# Patient Record
Sex: Male | Born: 1990 | Race: White | Hispanic: No | Marital: Single | Smoking: Current every day smoker
Health system: Southern US, Community
[De-identification: ages and names within clinical notes are randomized; demographics above are authoritative.]

## PROBLEM LIST (undated history)

## (undated) HISTORY — PX: WISDOM TOOTH EXTRACTION: SHX21

---
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
## 2011-05-07 ENCOUNTER — Inpatient Hospital Stay (INDEPENDENT_AMBULATORY_CARE_PROVIDER_SITE_OTHER)
Admission: RE | Admit: 2011-05-07 | Discharge: 2011-05-07 | Disposition: A | Discharge status: Home / Self Care | Payer: 59 | Source: Ambulatory Visit | Attending: Family Medicine | Admitting: Family Medicine

## 2011-05-07 DIAGNOSIS — T6391XA Toxic effect of contact with unspecified venomous animal, accidental (unintentional), initial encounter: Secondary | ICD-10-CM

## 2011-05-08 ENCOUNTER — Inpatient Hospital Stay (INDEPENDENT_AMBULATORY_CARE_PROVIDER_SITE_OTHER)
Admission: RE | Admit: 2011-05-08 | Discharge: 2011-05-08 | Disposition: A | Discharge status: Home / Self Care | Payer: 59 | Source: Ambulatory Visit | Attending: Emergency Medicine | Admitting: Emergency Medicine

## 2011-05-08 DIAGNOSIS — T6391XA Toxic effect of contact with unspecified venomous animal, accidental (unintentional), initial encounter: Secondary | ICD-10-CM

## 2016-04-18 ENCOUNTER — Other Ambulatory Visit: Payer: Self-pay | Admitting: Physical Medicine and Rehabilitation

## 2016-04-18 ENCOUNTER — Ambulatory Visit
Admission: RE | Admit: 2016-04-18 | Discharge: 2016-04-18 | Disposition: A | Discharge status: Home / Self Care | Payer: No Typology Code available for payment source | Source: Ambulatory Visit | Attending: Physical Medicine and Rehabilitation | Admitting: Physical Medicine and Rehabilitation

## 2016-04-18 DIAGNOSIS — Z0000 Encounter for general adult medical examination without abnormal findings: Secondary | ICD-10-CM

## 2017-08-07 IMAGING — CR DG CHEST 1V
1 series · 1 of 1 positions shown · non-contrast
Comparison: No recent.

CLINICAL DATA: Employ evaluation.

EXAM:
CHEST 1 VIEW

[w chest pa]
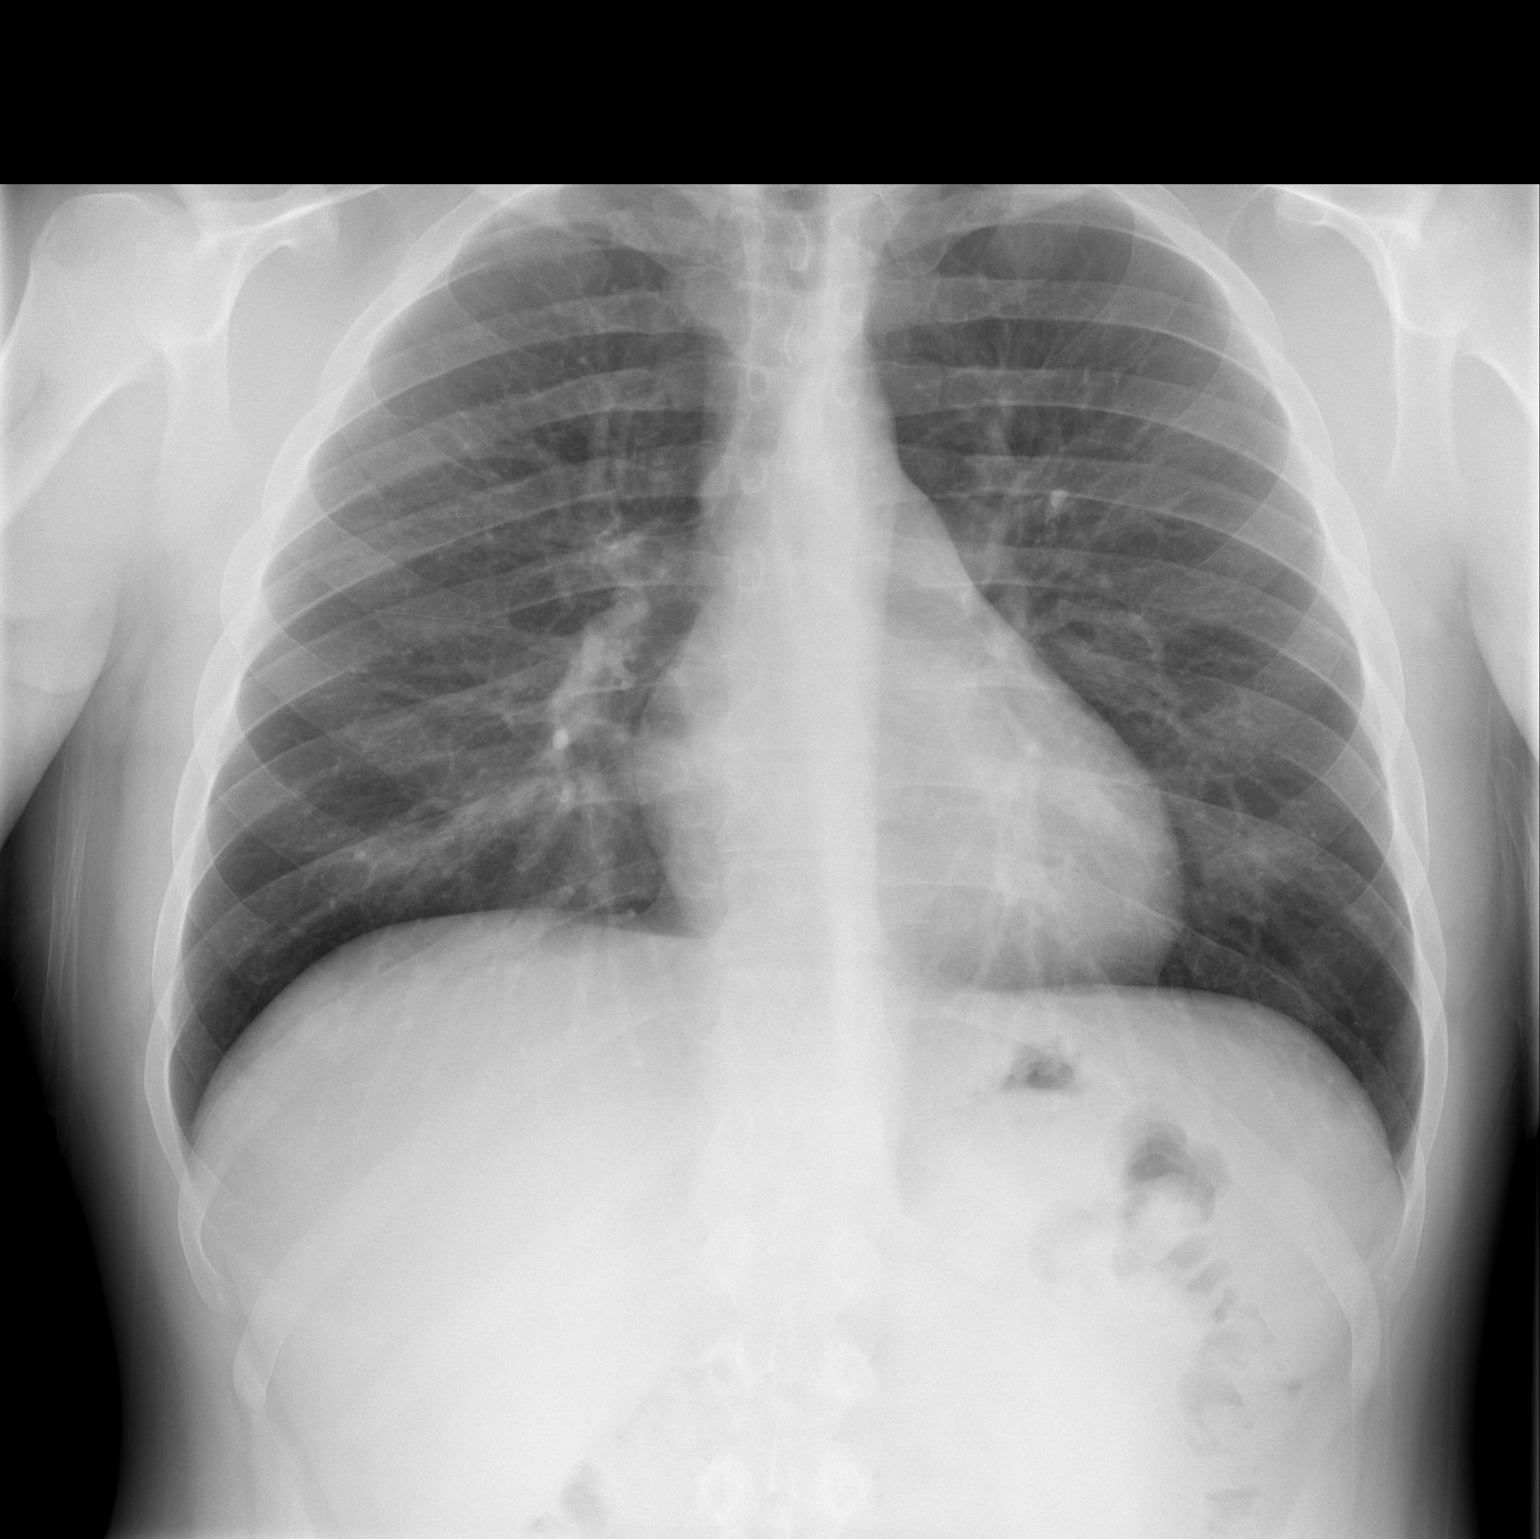

[1 of 1 positions shown; findings below may reference images not displayed]

FINDINGS: Mediastinum hilar structures normal. Low lung volumes. No focal
infiltrate. Heart size normal. No acute bony abnormality P
IMPRESSION: No acute cardiopulmonary disease.

## 2017-11-02 DIAGNOSIS — R05 Cough: Secondary | ICD-10-CM | POA: Diagnosis not present

## 2017-11-02 DIAGNOSIS — J309 Allergic rhinitis, unspecified: Secondary | ICD-10-CM | POA: Diagnosis not present

## 2017-11-02 DIAGNOSIS — J069 Acute upper respiratory infection, unspecified: Secondary | ICD-10-CM | POA: Diagnosis not present

## 2018-02-26 DIAGNOSIS — M5441 Lumbago with sciatica, right side: Secondary | ICD-10-CM | POA: Diagnosis not present

## 2018-02-26 DIAGNOSIS — M545 Low back pain: Secondary | ICD-10-CM | POA: Diagnosis not present

## 2018-02-26 DIAGNOSIS — F17200 Nicotine dependence, unspecified, uncomplicated: Secondary | ICD-10-CM | POA: Diagnosis not present

## 2018-03-09 DIAGNOSIS — Z6827 Body mass index (BMI) 27.0-27.9, adult: Secondary | ICD-10-CM | POA: Diagnosis not present

## 2018-03-09 DIAGNOSIS — M549 Dorsalgia, unspecified: Secondary | ICD-10-CM | POA: Diagnosis not present

## 2018-11-15 DIAGNOSIS — J069 Acute upper respiratory infection, unspecified: Secondary | ICD-10-CM | POA: Diagnosis not present

## 2018-11-15 DIAGNOSIS — J029 Acute pharyngitis, unspecified: Secondary | ICD-10-CM | POA: Diagnosis not present

## 2019-01-25 DIAGNOSIS — J309 Allergic rhinitis, unspecified: Secondary | ICD-10-CM | POA: Diagnosis not present

## 2019-01-25 DIAGNOSIS — T7840XD Allergy, unspecified, subsequent encounter: Secondary | ICD-10-CM | POA: Diagnosis not present

## 2019-01-25 DIAGNOSIS — R030 Elevated blood-pressure reading, without diagnosis of hypertension: Secondary | ICD-10-CM | POA: Diagnosis not present

## 2019-11-30 ENCOUNTER — Telehealth: Payer: Self-pay | Admitting: Orthopaedic Surgery

## 2019-11-30 DIAGNOSIS — M549 Dorsalgia, unspecified: Secondary | ICD-10-CM | POA: Diagnosis not present

## 2019-11-30 DIAGNOSIS — M546 Pain in thoracic spine: Secondary | ICD-10-CM | POA: Diagnosis not present

## 2019-11-30 NOTE — Telephone Encounter (Signed)
Patient/significant other Keith Whitaker called on patient's behalf; patient will have current treating primary care make a referral - states it is for his back. Will review upon receipt; understands.

## 2020-02-22 ENCOUNTER — Ambulatory Visit
Admission: EM | Admit: 2020-02-22 | Discharge: 2020-02-22 | Disposition: A | Discharge status: Home / Self Care | Payer: BC Managed Care – PPO | Attending: Emergency Medicine | Admitting: Emergency Medicine

## 2020-02-22 ENCOUNTER — Ambulatory Visit (INDEPENDENT_AMBULATORY_CARE_PROVIDER_SITE_OTHER): Payer: BC Managed Care – PPO

## 2020-02-22 ENCOUNTER — Encounter: Payer: Self-pay | Admitting: Emergency Medicine

## 2020-02-22 ENCOUNTER — Inpatient Hospital Stay: Admission: RE | Admit: 2020-02-22 | Payer: Self-pay | Source: Ambulatory Visit

## 2020-02-22 DIAGNOSIS — M79672 Pain in left foot: Secondary | ICD-10-CM | POA: Diagnosis not present

## 2020-02-22 DIAGNOSIS — S92511A Displaced fracture of proximal phalanx of right lesser toe(s), initial encounter for closed fracture: Secondary | ICD-10-CM | POA: Diagnosis not present

## 2020-02-22 DIAGNOSIS — M79675 Pain in left toe(s): Secondary | ICD-10-CM

## 2020-02-22 DIAGNOSIS — S90122A Contusion of left lesser toe(s) without damage to nail, initial encounter: Secondary | ICD-10-CM

## 2020-02-22 DIAGNOSIS — S92502A Displaced unspecified fracture of left lesser toe(s), initial encounter for closed fracture: Secondary | ICD-10-CM

## 2020-02-22 MED ORDER — ACETAMINOPHEN 500 MG PO TABS: 500.0000 mg | ORAL_TABLET | Freq: Four times a day (QID) | ORAL | 0 refills | Status: AC | PRN

## 2020-02-22 MED ORDER — PREDNISONE 10 MG PO TABS: 20.0000 mg | ORAL_TABLET | Freq: Every day | ORAL | 0 refills | Status: DC

## 2020-02-22 NOTE — ED Provider Notes (Signed)
[facility]   [id number] 02/22/20 Arrival Time: 1853   Chief Complaint  Patient presents with  . Foot Pain    left pinky toe     SUBJECTIVE: History from: patient.  Jaskirat Schwieger is a 29 y.o. male who presented to the urgent care with a complaint of left foot pinky toe swelling and bruised for the past 3 days.  Report he hit his foot to the door.  He localizes the pain to the left foot and pinky toe.  He describes the pain as constant and achy.  He has tried OTC medications without relief.  His symptoms are made worse with ROM.  He denies similar symptoms in the past.  Denies chills, fever, nausea, vomiting, diarrhea   ROS: As per HPI.  All other pertinent ROS negative.      History reviewed. No pertinent past medical history. Past Surgical History:  Procedure Laterality Date  . WISDOM TOOTH EXTRACTION     No Known Allergies No current facility-administered medications on file prior to encounter.   Current Outpatient Medications on File Prior to Encounter  Medication Sig Dispense Refill  . meloxicam (MOBIC) 7.5 MG tablet Take 7.5 mg by mouth 2 (two) times daily.     Social History   Socioeconomic History  . Marital status: Single    Spouse name: Not on file  . Number of children: Not on file  . Years of education: Not on file  . Highest education level: Not on file  Occupational History  . Not on file  Tobacco Use  . Smoking status: Current Every Day Smoker  . Smokeless tobacco: Never Used  Substance and Sexual Activity  . Alcohol use: Yes  . Drug use: Never  . Sexual activity: Not on file  Other Topics Concern  . Not on file  Social History Narrative  . Not on file   Social Determinants of Health   Financial Resource Strain:   . Difficulty of Paying Living Expenses:   Food Insecurity:   . Worried About Programme researcher, broadcasting/film/video in the Last Year:   . Barista in the Last Year:   Transportation Needs:   . Freight forwarder (Medical):     Marland Kitchen Lack of Transportation (Non-Medical):   Physical Activity:   . Days of Exercise per Week:   . Minutes of Exercise per Session:   Stress:   . Feeling of Stress :   Social Connections:   . Frequency of Communication with Friends and Family:   . Frequency of Social Gatherings with Friends and Family:   . Attends Religious Services:   . Active Member of Clubs or Organizations:   . Attends Banker Meetings:   Marland Kitchen Marital Status:   Intimate Partner Violence:   . Fear of Current or Ex-Partner:   . Emotionally Abused:   Marland Kitchen Physically Abused:   . Sexually Abused:    No family history on file.  OBJECTIVE:  Vitals:   02/22/20 1901 02/22/20 1902  BP: (!) 145/81   Pulse: (!) 105   Resp: 18   Temp: 98.9 F (37.2 C)   TempSrc: Oral   SpO2: 96%   Weight:  220 lb (99.8 kg)     Physical Exam Vitals and nursing note reviewed.  Constitutional:      General: He is not in acute distress.    Appearance: Normal appearance. He is normal weight. He is not ill-appearing, toxic-appearing or diaphoretic.  Cardiovascular:  Rate and Rhythm: Normal rate and regular rhythm.     Pulses: Normal pulses.     Heart sounds: Normal heart sounds. No murmur heard.  No friction rub. No gallop.   Pulmonary:     Effort: Pulmonary effort is normal. No respiratory distress.     Breath sounds: Normal breath sounds. No stridor. No wheezing, rhonchi or rales.  Chest:     Chest wall: No tenderness.  Musculoskeletal:        General: Swelling and tenderness present.     Comments: Patient is able to bear weight and ambulate with pain.  No surfacet trauma,  erythema, lesion, ulcer or break in skin integrity.  Bruising and ecchymosis present.  The left foot is  with obvious deformity when compared to the right foot.  Limited range of motion due to pain.  Neurovascular status intact  Neurological:     Mental Status: He is alert.      LABS:  No results found for this or any previous visit (from  the past 24 hour(s)).   ASSESSMENT & PLAN:  1. Left foot pain   2. Closed fracture of phalanx of left fifth toe, initial encounter     Meds ordered this encounter  Medications  . predniSONE (DELTASONE) 10 MG tablet    Sig: Take 2 tablets (20 mg total) by mouth daily.    Dispense:  15 tablet    Refill:  0  . acetaminophen (TYLENOL) 500 MG tablet    Sig: Take 1 tablet (500 mg total) by mouth every 6 (six) hours as needed.    Dispense:  30 tablet    Refill:  0   Patient is stable at discharge.  The left foot x-ray is positive for acute fracture of the fifth toe phalanx.  I have reviewed the x-ray myself and the radiologist interpretation.  I am in agreement with the radiologist interpretation.  He was advised to follow-up with orthopedic.  Discharge Instructions Take Tylenol/prednisone as needed for pain and inflammation Follow RICE instruction as attached Follow-up with orthopedic Return or go to ED for worsening of symptoms  Reviewed expectations re: course of current medical issues. Questions answered. Outlined signs and symptoms indicating need for more acute intervention. Patient verbalized understanding. After Visit Summary given.      Note: This document was prepared using Dragon voice recognition software and may include unintentional dictation errors.    Durward Parcel, FNP 02/22/20 1949

## 2020-02-22 NOTE — Discharge Instructions (Addendum)
Take Tylenol/prednisone as needed for pain and inflammation Follow RICE instruction  Follow-up with orthopedic Return or go to ED for worsening of symptoms

## 2020-02-22 NOTE — ED Triage Notes (Signed)
Monday night stumped pinky toe on door.  Starting to swell slightly and bruised.

## 2020-02-24 ENCOUNTER — Encounter: Payer: Self-pay | Admitting: Orthopedic Surgery

## 2020-02-24 ENCOUNTER — Other Ambulatory Visit: Payer: Self-pay

## 2020-02-24 ENCOUNTER — Ambulatory Visit: Payer: BC Managed Care – PPO | Admitting: Orthopedic Surgery

## 2020-02-24 VITALS — BP 144/81 | HR 71 | Ht 71.0 in | Wt 223.6 lb

## 2020-02-24 DIAGNOSIS — S92515A Nondisplaced fracture of proximal phalanx of left lesser toe(s), initial encounter for closed fracture: Secondary | ICD-10-CM | POA: Diagnosis not present

## 2020-02-24 NOTE — Progress Notes (Signed)
NEW PROBLEM//OFFICE VISIT  Chief Complaint  Patient presents with  . 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   Neurological: Negative for tingling.     No past medical history on file.  Past Surgical History:  Procedure Laterality Date  . WISDOM TOOTH EXTRACTION      No family history on file. Social History   Tobacco Use  . Smoking status: Current Every Day Smoker  . Smokeless tobacco: Never Used  Substance Use Topics  . Alcohol use: Yes  . Drug use: Never    No Known Allergies  Current Meds  Medication Sig  . acetaminophen (TYLENOL) 500 MG tablet Take 1 tablet (500 mg total) by mouth every 6 (six) hours as needed.  . meloxicam (MOBIC) 7.5 MG tablet Take 7.5 mg by mouth 2 (two) times daily.    BP (!) 144/81   Pulse 71   Ht 5\' 11"  (1.803 m)   Wt 223 lb 9.6 oz (101.4 kg)   BMI 31.19 kg/m   Physical Exam Constitutional:      General: He is not in acute distress.    Appearance: He is well-developed.  Cardiovascular:     Comments: No peripheral edema Skin:    General: Skin is warm and dry.  Neurological:     Mental Status: He is alert and oriented to person, place, and time.     Sensory: No sensory deficit.     Coordination: Coordination normal.     Gait: Gait abnormal.     Deep Tendon Reflexes: Reflexes are normal and symmetric.     Ortho Exam  Left foot small toe   Normal alignment small amount of swelling   Tender mild   MEDICAL DECISION MAKING  A.  Encounter Diagnosis  Name Primary?  . Closed nondisplaced fracture of proximal phalanx of lesser toe of left foot, initial encounter-small toe Yes    B. DATA ANALYSED:    IMAGING: Independent interpretation of images: transverse proximal phalanx non displaced frx left small toe   Orders:   Outside records reviewed: no  C. MANAGEMENT    Hard sole shoe (the one from urgent care is a cast shoe )  2 weeks come back and see if he can RTW  No orders of the defined types were placed in this encounter.     , MD  02/24/2020 11:38 AM

## 2020-02-24 NOTE — Patient Instructions (Signed)
Hard sole shoe   OOW 2 weeks

## 2020-03-01 DIAGNOSIS — S92912A Unspecified fracture of left toe(s), initial encounter for closed fracture: Secondary | ICD-10-CM | POA: Diagnosis not present

## 2020-03-01 DIAGNOSIS — Z2821 Immunization not carried out because of patient refusal: Secondary | ICD-10-CM | POA: Diagnosis not present

## 2020-03-12 DIAGNOSIS — S92515A Nondisplaced fracture of proximal phalanx of left lesser toe(s), initial encounter for closed fracture: Secondary | ICD-10-CM | POA: Insufficient documentation

## 2020-03-13 ENCOUNTER — Ambulatory Visit: Payer: BC Managed Care – PPO

## 2020-03-13 ENCOUNTER — Other Ambulatory Visit: Payer: Self-pay

## 2020-03-13 ENCOUNTER — Ambulatory Visit (INDEPENDENT_AMBULATORY_CARE_PROVIDER_SITE_OTHER): Payer: BC Managed Care – PPO | Admitting: Orthopedic Surgery

## 2020-03-13 ENCOUNTER — Encounter: Payer: Self-pay | Admitting: Orthopedic Surgery

## 2020-03-13 DIAGNOSIS — S92515D Nondisplaced fracture of proximal phalanx of left lesser toe(s), subsequent encounter for fracture with routine healing: Secondary | ICD-10-CM | POA: Diagnosis not present

## 2020-03-13 NOTE — Progress Notes (Signed)
Chief Complaint  Patient presents with  . Foot Injury    left 5th toe feels better    Fracture care Follow-up fractured left toe small digit left foot patient says he is feeling better toe looks normal X-ray looks like the fracture stable patient is released to return to work tomorrow

## 2020-03-13 NOTE — Patient Instructions (Signed)
rtw tomorrow

## 2020-09-07 DIAGNOSIS — Z7189 Other specified counseling: Secondary | ICD-10-CM | POA: Diagnosis not present

## 2020-09-07 DIAGNOSIS — Z2821 Immunization not carried out because of patient refusal: Secondary | ICD-10-CM | POA: Diagnosis not present

## 2020-09-07 DIAGNOSIS — U071 COVID-19: Secondary | ICD-10-CM | POA: Diagnosis not present

## 2021-06-12 IMAGING — DX DG FOOT COMPLETE 3+V*L*
3 series · 3 of 3 positions shown · non-contrast
Comparison: None.

CLINICAL DATA: Left foot pain after injury. Pinky toe injury.
Bruising and pain to fifth toe.

EXAM:
LEFT FOOT - COMPLETE 3+ VIEW

[foot ap]
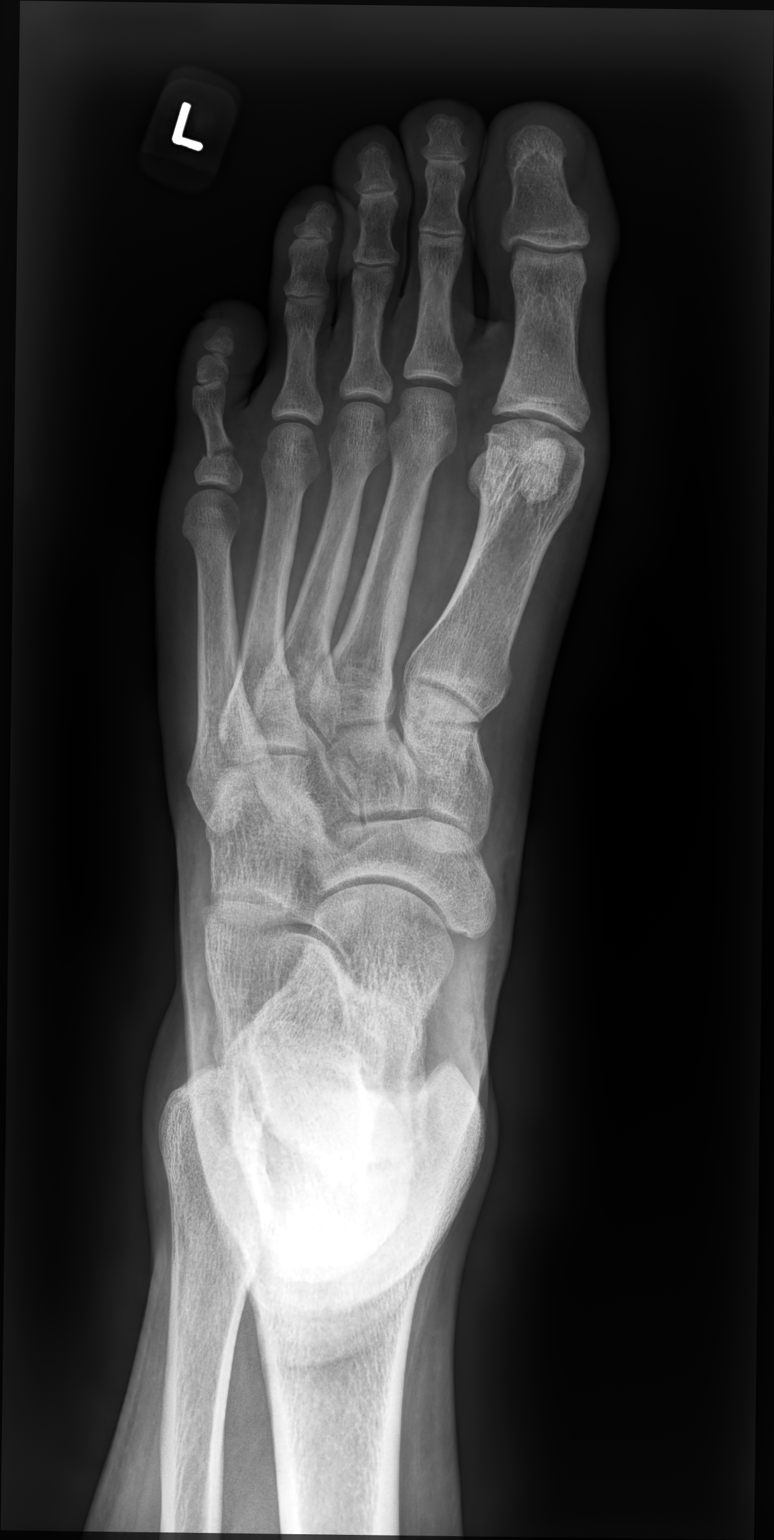

[foot mlo]
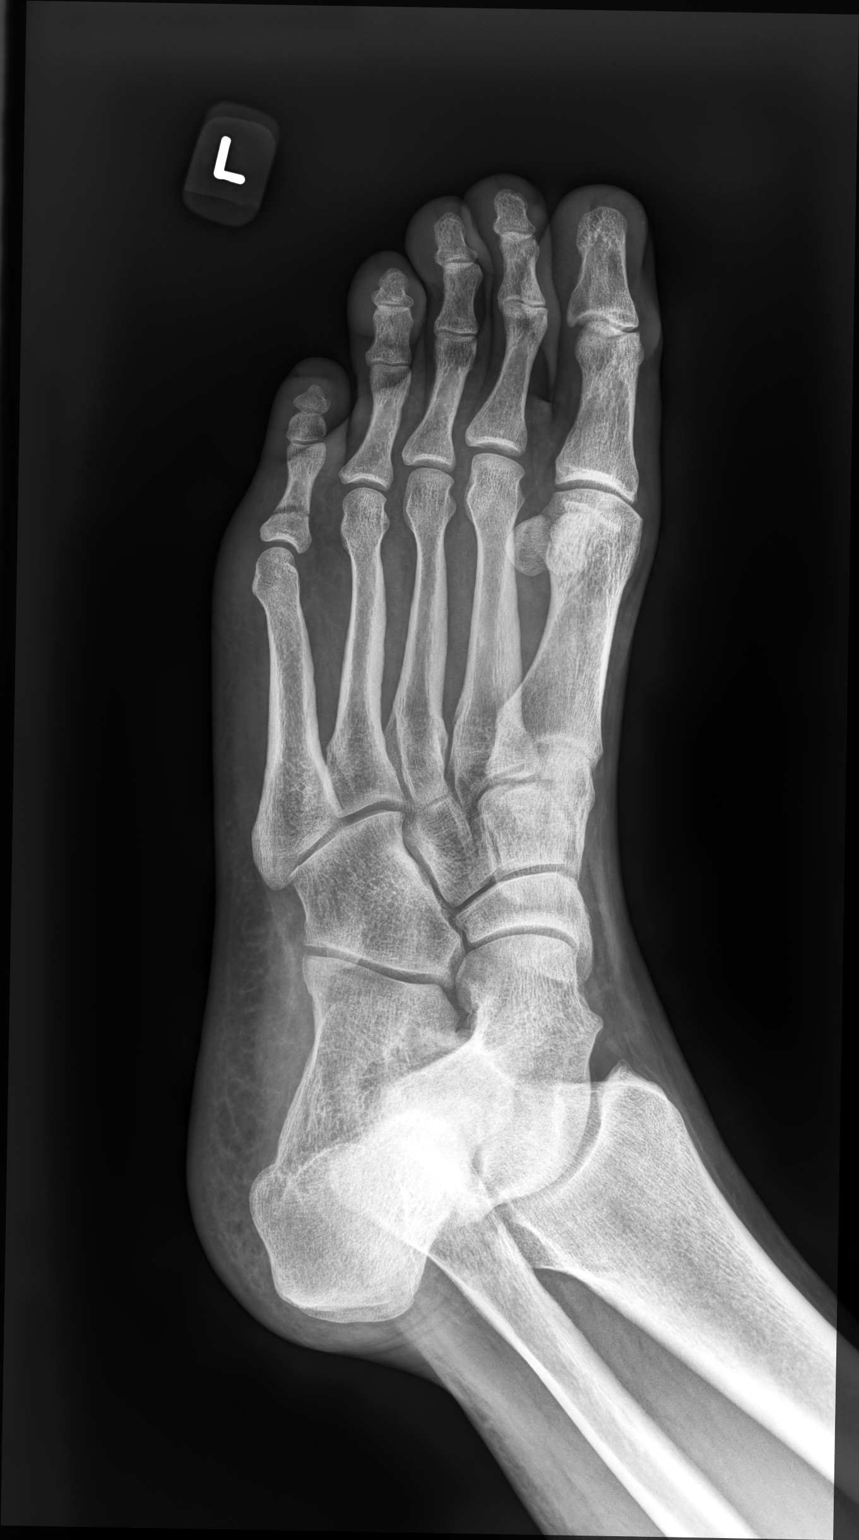

[foot lat]
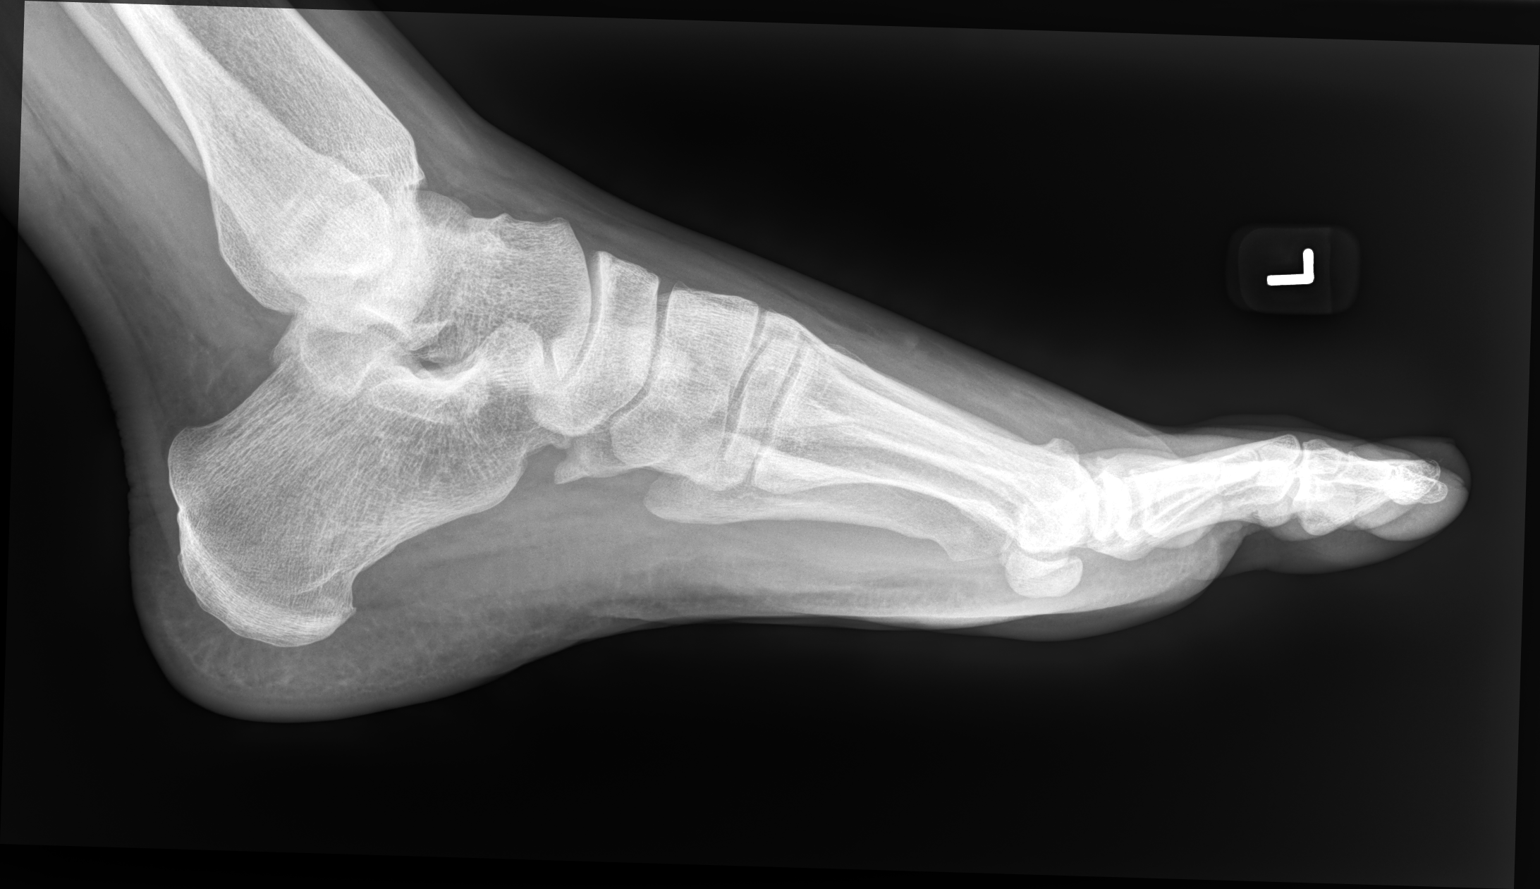

[3 of 3 positions shown; findings below may reference images not displayed]

FINDINGS: Fifth toe proximal phalanx fracture is mildly displaced and
angulated with apex medial angulation. No intra-articular extension.
No additional fracture of the foot. Mild soft tissue edema at the
fracture site.
IMPRESSION: Mildly displaced angulated fifth toe proximal phalanx fracture.
